# Patient Record
Sex: Male | Born: 2000 | Race: White | Hispanic: No | Marital: Single | Smoking: Former smoker
Health system: Southern US, Community
[De-identification: ages and names within clinical notes are randomized; demographics above are authoritative.]

## PROBLEM LIST (undated history)

## (undated) DIAGNOSIS — J45909 Unspecified asthma, uncomplicated: Secondary | ICD-10-CM

## (undated) DIAGNOSIS — S82899A Other fracture of unspecified lower leg, initial encounter for closed fracture: Secondary | ICD-10-CM

## (undated) DIAGNOSIS — K219 Gastro-esophageal reflux disease without esophagitis: Secondary | ICD-10-CM

---
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
## 2018-03-14 IMAGING — DX DG ANKLE COMPLETE 3+V*L*
3 series · 3 of 3 positions shown · non-contrast
Comparison: None.

CLINICAL DATA: Skating injury

EXAM:
LEFT ANKLE COMPLETE - 3+ VIEW

[ankle ap]
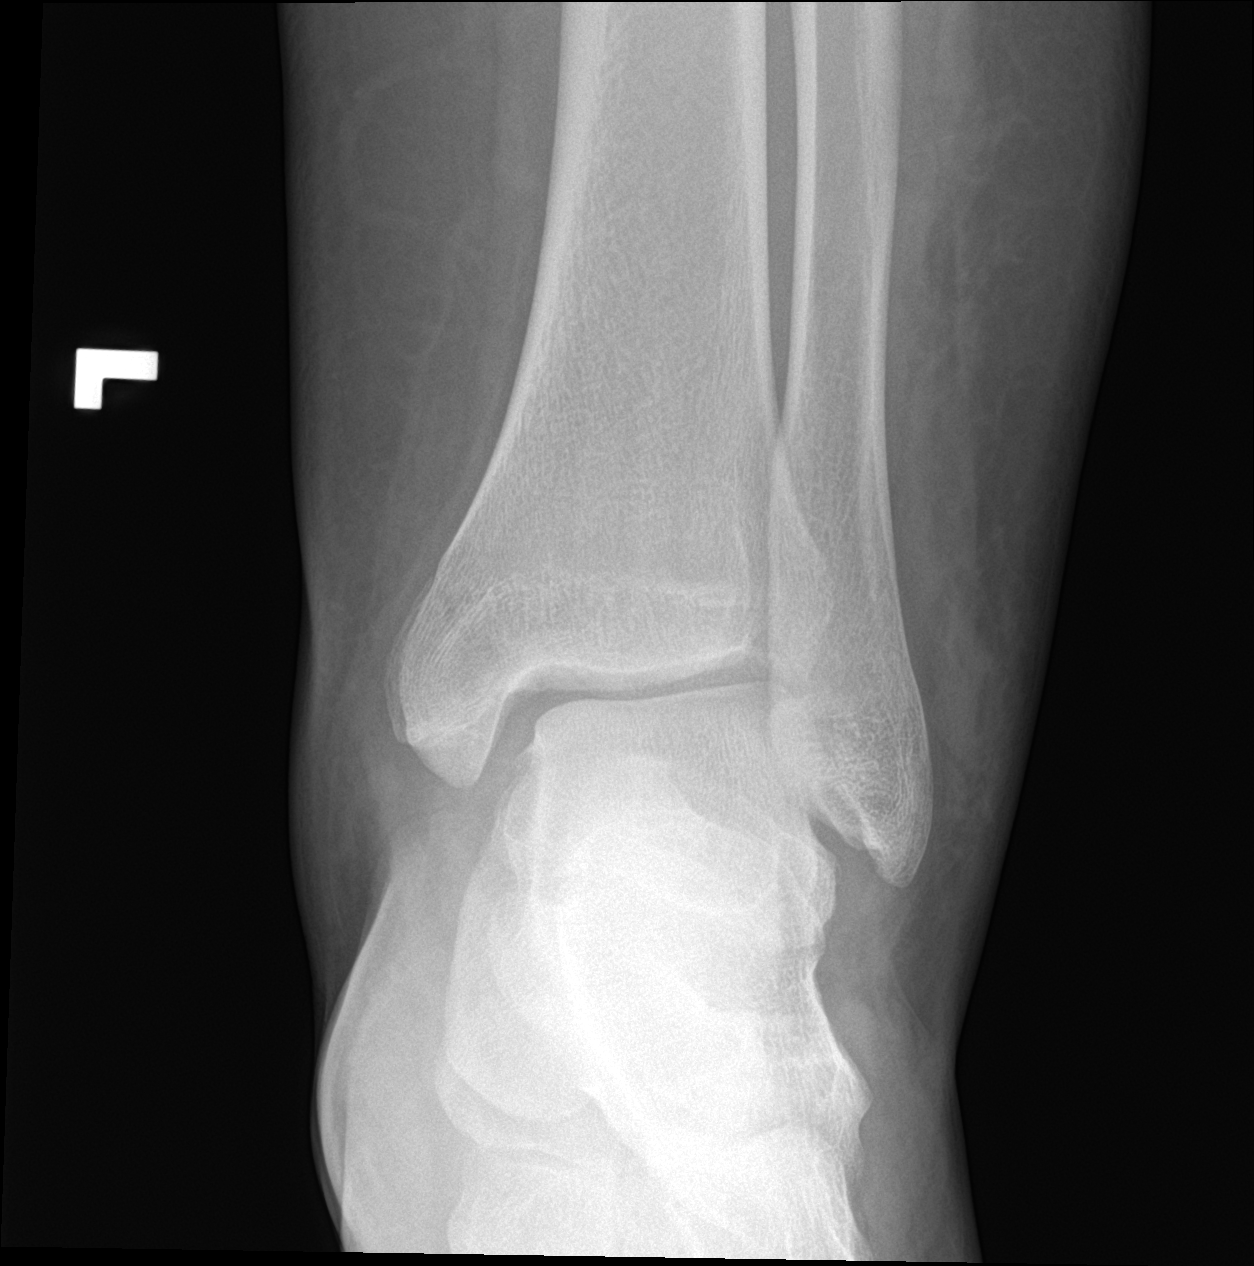

[ankle obl]
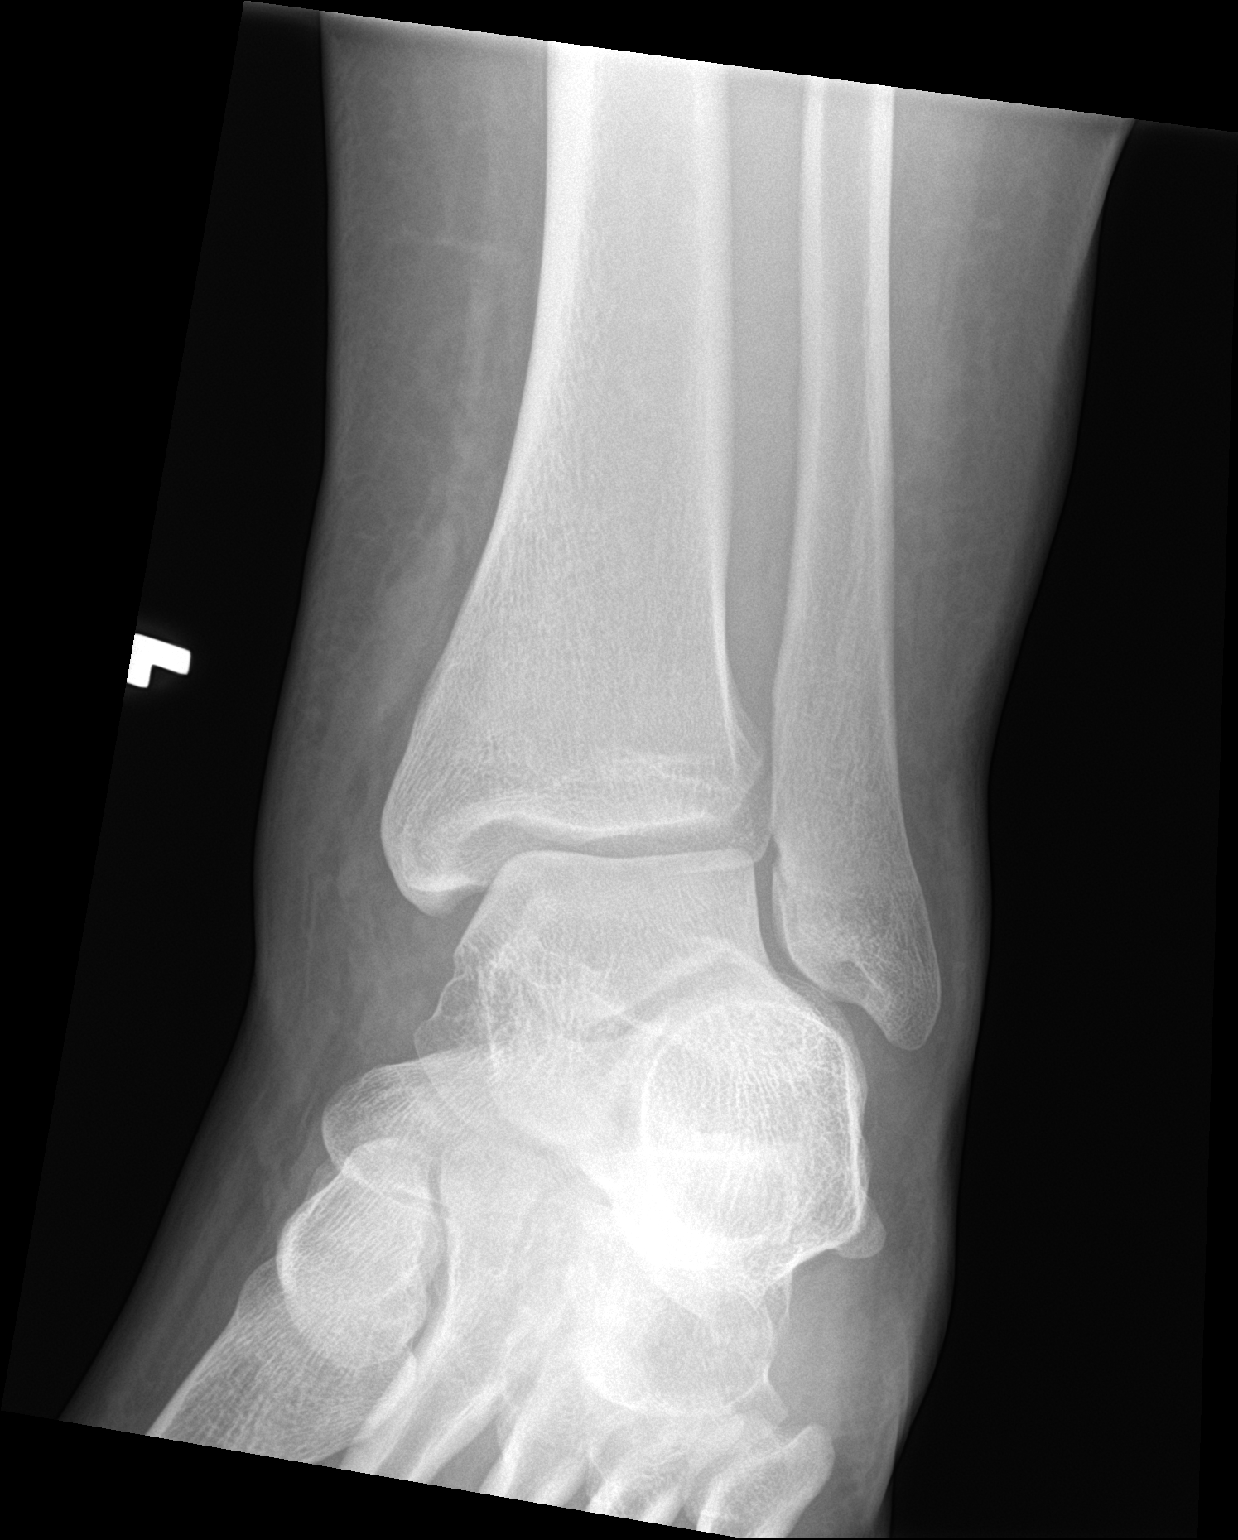

[ankle lat]
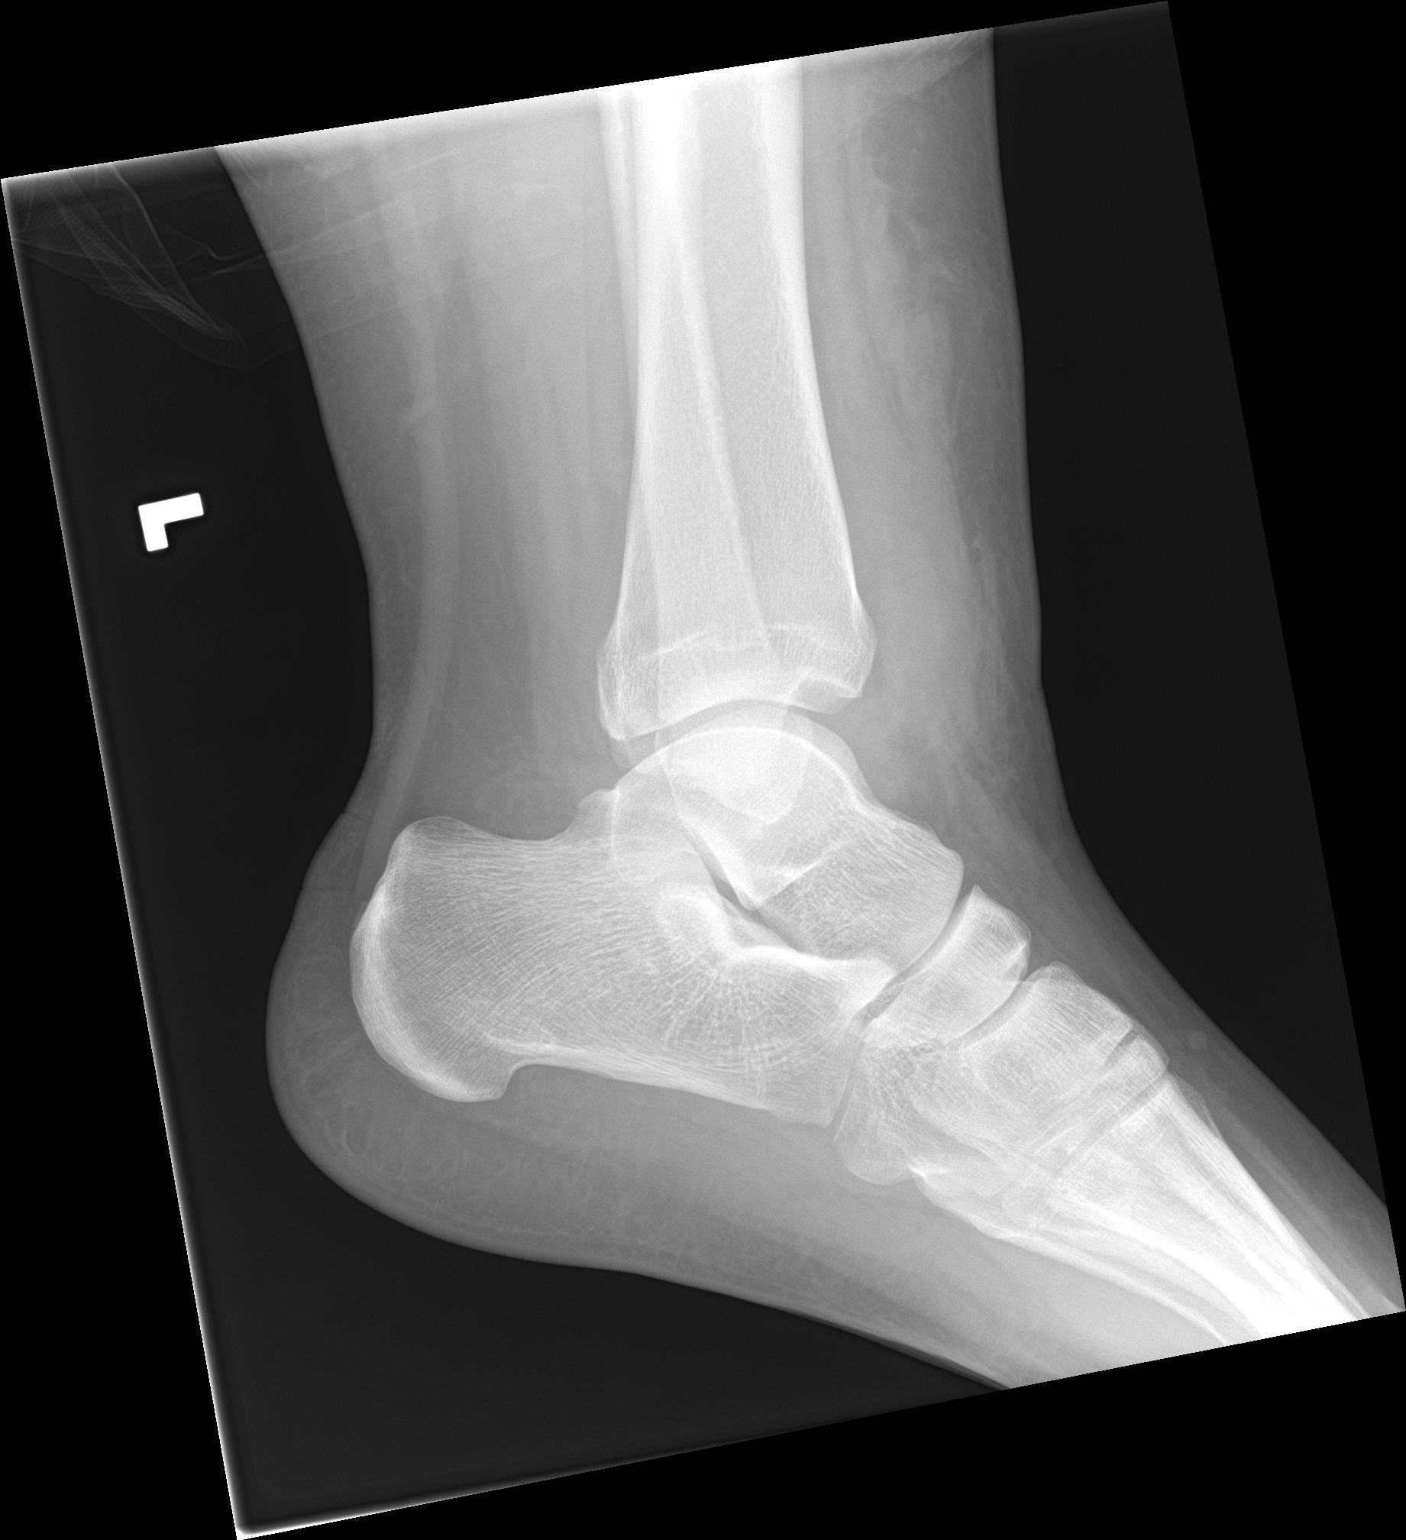

[3 of 3 positions shown; findings below may reference images not displayed]

FINDINGS: No acute fracture. No dislocation. There is edema within the soft
tissues anterior to the lower tibia.
IMPRESSION: No acute bony pathology.  Edema within the soft tissues is noted.

## 2018-03-25 DIAGNOSIS — R509 Fever, unspecified: Secondary | ICD-10-CM | POA: Diagnosis not present

## 2018-03-25 DIAGNOSIS — J209 Acute bronchitis, unspecified: Secondary | ICD-10-CM | POA: Diagnosis not present

## 2018-12-16 ENCOUNTER — Other Ambulatory Visit: Payer: Self-pay

## 2018-12-16 ENCOUNTER — Emergency Department (HOSPITAL_COMMUNITY): Payer: No Typology Code available for payment source

## 2018-12-16 ENCOUNTER — Emergency Department (HOSPITAL_COMMUNITY)
Admission: EM | Admit: 2018-12-16 | Discharge: 2018-12-16 | Disposition: A | Discharge status: Home / Self Care | Payer: No Typology Code available for payment source | Attending: Emergency Medicine | Admitting: Emergency Medicine

## 2018-12-16 ENCOUNTER — Encounter (HOSPITAL_COMMUNITY): Payer: Self-pay

## 2018-12-16 DIAGNOSIS — Z87891 Personal history of nicotine dependence: Secondary | ICD-10-CM | POA: Insufficient documentation

## 2018-12-16 DIAGNOSIS — S6991XA Unspecified injury of right wrist, hand and finger(s), initial encounter: Secondary | ICD-10-CM | POA: Diagnosis present

## 2018-12-16 DIAGNOSIS — W208XXA Other cause of strike by thrown, projected or falling object, initial encounter: Secondary | ICD-10-CM | POA: Diagnosis not present

## 2018-12-16 DIAGNOSIS — J45909 Unspecified asthma, uncomplicated: Secondary | ICD-10-CM | POA: Insufficient documentation

## 2018-12-16 DIAGNOSIS — S63511A Sprain of carpal joint of right wrist, initial encounter: Secondary | ICD-10-CM | POA: Diagnosis not present

## 2018-12-16 DIAGNOSIS — Z7982 Long term (current) use of aspirin: Secondary | ICD-10-CM | POA: Diagnosis not present

## 2018-12-16 DIAGNOSIS — Y939 Activity, unspecified: Secondary | ICD-10-CM | POA: Insufficient documentation

## 2018-12-16 DIAGNOSIS — Y9259 Other trade areas as the place of occurrence of the external cause: Secondary | ICD-10-CM | POA: Diagnosis not present

## 2018-12-16 DIAGNOSIS — S8001XA Contusion of right knee, initial encounter: Secondary | ICD-10-CM | POA: Diagnosis not present

## 2018-12-16 DIAGNOSIS — Y990 Civilian activity done for income or pay: Secondary | ICD-10-CM | POA: Diagnosis not present

## 2018-12-16 DIAGNOSIS — Z79899 Other long term (current) drug therapy: Secondary | ICD-10-CM | POA: Insufficient documentation

## 2018-12-16 NOTE — ED Triage Notes (Signed)
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  Pt states he landed on his right knee and right wrist.  Pt ambulatory with minimal diff.

## 2018-12-16 NOTE — ED Provider Notes (Signed)
[facility] EMERGENCY DEPARTMENT Provider Note   CSN: [id number] Arrival date & time: 12/16/18  0156   Time seen 5:00 AM  History   Chief Complaint Chief Complaint  Patient presents with  . Fall    knee pain    HPI Jerome Beck is a 18 y.o. male.     HPI  Patient states about 130 this morning he was at work and they were tearing down boxes.  He states a forklift was driving by and hit a box that slid forward and hit him in the feet and knocked him down.  He states he landed with his weight on his right knee and his right wrist.  Patient is right-handed.  He denies hitting his head or loss of consciousness.  He denies any other injury.  PCP Patient, No Pcp Per   Past Medical History:  Diagnosis Date  . Acid reflux    OTC prn  . Ankle fracture 12/14/2015   left  . Asthma    daily and prn inhalers    There are no active problems to display for this patient.   Past Surgical History:  Procedure Laterality Date  . SYNDESMOSIS REPAIR Left 01/02/2016   Procedure: OPEN REDUCTION INTERNAL FIXATION (ORIF) LEFT SYNDESMOSIS;  Surgeon: Renette Butters, MD;  Location: [place];  Service: Orthopedics;  Laterality: Left;        Home Medications    Prior to Admission medications   Medication Sig Start Date End Date Taking? Authorizing Provider  albuterol (PROAIR HFA) 108 (90 BASE) MCG/ACT inhaler Inhale 2 puffs into the lungs every 4 (four) hours as needed for wheezing or shortness of breath.    [provider]  aspirin EC 81 MG tablet Take 1 tablet (81 mg total) by mouth daily. 01/02/16   Martensen, Charna Elizabeth III, PA-C  beclomethasone (QVAR) 80 MCG/ACT inhaler Inhale 2 puffs into the lungs 2 (two) times daily.    [provider]  docusate sodium (COLACE) 100 MG capsule Take 1 capsule (100 mg total) by mouth 2 (two) times daily. To prevent constipation while taking pain medication. 01/02/16   Martensen, Charna Elizabeth III, PA-C  ibuprofen  (ADVIL,MOTRIN) 800 MG tablet Take 1 tablet (800 mg total) by mouth 3 (three) times daily. 12/14/15   Triplett, Tammy, PA-C  loratadine (CLARITIN) 10 MG tablet Take 10 mg by mouth daily.    [provider]  ondansetron (ZOFRAN) 4 MG tablet Take 1 tablet (4 mg total) by mouth every 8 (eight) hours as needed for nausea or vomiting. 01/02/16   Martensen, Charna Elizabeth III, PA-C  oxyCODONE-acetaminophen (ROXICET) 5-325 MG tablet Take 1-2 tablets by mouth every 6 (six) hours as needed for severe pain. 01/02/16   Prudencio Burly III, PA-C    Family History Family History  Problem Relation Age of Onset  . Hypertension Mother   . Diabetes Father     Social History Social History   Tobacco Use  . Smoking status: Former Research scientist (life sciences)  . Smokeless tobacco: Never Used  Substance Use Topics  . Alcohol use: No  . Drug use: No  employed   Allergies   Patient has no known allergies.   Review of Systems Review of Systems  All other systems reviewed and are negative.    Physical Exam Updated Vital Signs BP 140/81 (BP Location: Right Arm)   Pulse 91   Temp 98.1 F (36.7 C) (Oral)   Resp (!) 21   Ht 6' 1.5" (  1.867 m)   Wt (!) 176.4 kg   SpO2 99%   BMI 50.63 kg/m   Physical Exam Vitals signs and nursing note reviewed.  Constitutional:      Appearance: Normal appearance. He is obese.  HENT:     Head: Normocephalic and atraumatic.     Nose: Nose normal.  Eyes:     Extraocular Movements: Extraocular movements intact.     Conjunctiva/sclera: Conjunctivae normal.  Neck:     Musculoskeletal: Normal range of motion.  Cardiovascular:     Rate and Rhythm: Normal rate.  Pulmonary:     Effort: Pulmonary effort is normal. No respiratory distress.  Musculoskeletal:        General: Tenderness present. No swelling or deformity.     Comments: On exam of his right knee there is no obvious abrasion, bruising, or swelling.  He tender diffusely but seems most tender when I palpate him  over the medial joint space.  On exam of his right wrist he is most tender over the distal ulnar styloid region and states he has some discomfort on range of motion of his little finger that extends up into the wrist area.  There is no obvious swelling or deformity.  There is no abrasion seen.  Skin:    General: Skin is warm and dry.     Findings: No lesion or rash.  Neurological:     General: No focal deficit present.     Mental Status: He is alert and oriented to person, place, and time.     Cranial Nerves: No cranial nerve deficit.  Psychiatric:        Mood and Affect: Mood normal.        Behavior: Behavior normal.        Thought Content: Thought content normal.      ED Treatments / Results  Labs (all labs ordered are listed, but only abnormal results are displayed) Labs Reviewed - No data to display  EKG None  Radiology Dg Wrist Complete Right  Result Date: 12/16/2018 CLINICAL DATA:  Fall with right wrist pain. EXAM: RIGHT WRIST - COMPLETE 3+ VIEW COMPARISON:  None. FINDINGS: There is no evidence of fracture or dislocation. Suspect carpal boss. There is no evidence of arthropathy or other focal bone abnormality. Soft tissues are unremarkable. IMPRESSION: No acute fracture or dislocation of the right wrist. Electronically Signed   By: Narda Rutherford M.D.   On: 12/16/2018 03:46   Dg Knee Complete 4 Views Right  Result Date: 12/16/2018 CLINICAL DATA:  Right knee pain after fall. EXAM: RIGHT KNEE - COMPLETE 4+ VIEW COMPARISON:  None. FINDINGS: No evidence of fracture, dislocation, or joint effusion. No evidence of arthropathy or other focal bone abnormality. Soft tissues are unremarkable. IMPRESSION: Negative radiographs of the right knee. Electronically Signed   By: Narda Rutherford M.D.   On: 12/16/2018 03:48    Procedures Procedures (including critical care time)  Medications Ordered in ED Medications - No data to display   Initial Impression / Assessment and Plan / ED  Course  I have reviewed the triage vital signs and the nursing notes.  Pertinent labs & imaging results that were available during my care of the patient were reviewed by me and considered in my medical decision making (see chart for details).       We discussed his x-ray results.  Patient's leg is too large for knee immobilizer.  He was placed in a Velcro wrist splint.  He actually ambulated  back to the ED without difficulty.  He was advised to use ice packs and take ibuprofen for pain.  He should ask his work if they have a Designer, multimediaWorkmen's Comp. doctor or if not I will refer him to the local Workmen's Comp. physician in [place].  Final Clinical Impressions(s) / ED Diagnoses   Final diagnoses:  Sprain of carpal joint of right wrist, initial encounter  Contusion of right knee, initial encounter    ED Discharge Orders    None    OTC ibuprofen  Plan discharge  Devoria AlbeIva Cloys Vera, MD, Concha PyoFACEP    Anelia Carriveau, MD 12/16/18 [phone]

## 2018-12-16 NOTE — Discharge Instructions (Addendum)
Ice packs to the injured areas.  Elevate the injured areas when you can.  Wear the splint on your wrist for comfort.  Take ibuprofen 600 mg every 6 hours as needed for pain in your knee or your wrist.  Please go back to work and ask them if they have a specific Workmen's Comp. physician you should follow-up with later today, if they do not, you can follow-up with Dr. Jomarie Longs.  Call his office to be seen.  Make sure you tell them it was an on-the-job injury.

## 2019-11-29 DIAGNOSIS — R609 Edema, unspecified: Secondary | ICD-10-CM | POA: Diagnosis not present

## 2019-11-29 DIAGNOSIS — S8011XA Contusion of right lower leg, initial encounter: Secondary | ICD-10-CM | POA: Diagnosis not present

## 2019-11-29 DIAGNOSIS — S8001XA Contusion of right knee, initial encounter: Secondary | ICD-10-CM | POA: Diagnosis not present

## 2019-11-29 DIAGNOSIS — M549 Dorsalgia, unspecified: Secondary | ICD-10-CM | POA: Diagnosis not present

## 2022-10-14 ENCOUNTER — Other Ambulatory Visit (HOSPITAL_COMMUNITY): Payer: Self-pay

## 2022-10-19 ENCOUNTER — Other Ambulatory Visit: Payer: Self-pay
